# Patient Record
Sex: Female | Born: 1990 | Race: Black or African American | Hispanic: No | Marital: Single | Smoking: Former smoker
Health system: Southern US, Community
[De-identification: ages and names within clinical notes are randomized; demographics above are authoritative.]

## PROBLEM LIST (undated history)

## (undated) HISTORY — PX: INDUCED ABORTION: SHX677

---

## 2009-07-30 ENCOUNTER — Emergency Department (HOSPITAL_COMMUNITY): Admission: EM | Admit: 2009-07-30 | Discharge: 2009-07-30 | Payer: Self-pay | Admitting: Emergency Medicine

## 2010-10-28 ENCOUNTER — Inpatient Hospital Stay (INDEPENDENT_AMBULATORY_CARE_PROVIDER_SITE_OTHER)
Admission: RE | Admit: 2010-10-28 | Discharge: 2010-10-28 | Disposition: A | Discharge status: Home / Self Care | Payer: Federal, State, Local not specified - PPO | Source: Ambulatory Visit | Attending: Family Medicine | Admitting: Family Medicine

## 2010-10-28 DIAGNOSIS — N739 Female pelvic inflammatory disease, unspecified: Secondary | ICD-10-CM

## 2010-10-28 LAB — POCT URINALYSIS DIP (DEVICE)
Bilirubin Urine: NEGATIVE
Ketones, ur: NEGATIVE mg/dL
Specific Gravity, Urine: 1.025 (ref 1.005–1.030)
pH: 6 (ref 5.0–8.0)

## 2010-10-28 LAB — WET PREP, GENITAL: Trich, Wet Prep: NONE SEEN

## 2010-10-29 LAB — GC/CHLAMYDIA PROBE AMP, GENITAL: Chlamydia, DNA Probe: POSITIVE — AB

## 2012-05-13 ENCOUNTER — Emergency Department (HOSPITAL_COMMUNITY): Payer: Federal, State, Local not specified - PPO

## 2012-05-13 ENCOUNTER — Emergency Department (HOSPITAL_COMMUNITY)
Admission: EM | Admit: 2012-05-13 | Discharge: 2012-05-13 | Disposition: A | Discharge status: Home / Self Care | Payer: Federal, State, Local not specified - PPO | Attending: Emergency Medicine | Admitting: Emergency Medicine

## 2012-05-13 ENCOUNTER — Encounter (HOSPITAL_COMMUNITY): Payer: Self-pay | Admitting: Emergency Medicine

## 2012-05-13 DIAGNOSIS — F17200 Nicotine dependence, unspecified, uncomplicated: Secondary | ICD-10-CM | POA: Insufficient documentation

## 2012-05-13 DIAGNOSIS — J069 Acute upper respiratory infection, unspecified: Secondary | ICD-10-CM | POA: Insufficient documentation

## 2012-05-13 DIAGNOSIS — J40 Bronchitis, not specified as acute or chronic: Secondary | ICD-10-CM | POA: Insufficient documentation

## 2012-05-13 MED ORDER — HYDROCOD POLST-CHLORPHEN POLST 10-8 MG/5ML PO LQCR: 5.0000 mL | Freq: Once | ORAL | Status: DC

## 2012-05-13 MED ORDER — HYDROCOD POLST-CHLORPHEN POLST 10-8 MG/5ML PO LQCR: 5.0000 mL | Freq: Two times a day (BID) | ORAL | Status: DC | PRN

## 2012-05-13 MED ORDER — ALBUTEROL SULFATE (5 MG/ML) 0.5% IN NEBU
5.0000 mg | INHALATION_SOLUTION | Freq: Once | RESPIRATORY_TRACT | Status: AC
  Administered 2012-05-13: 5 mg via RESPIRATORY_TRACT
  Filled 2012-05-13: qty 1

## 2012-05-13 MED ORDER — ALBUTEROL SULFATE HFA 108 (90 BASE) MCG/ACT IN AERS
2.0000 | INHALATION_SPRAY | RESPIRATORY_TRACT | Status: DC | PRN
  Administered 2012-05-13: 2 via RESPIRATORY_TRACT
  Filled 2012-05-13: qty 6.7

## 2012-05-13 NOTE — ED Notes (Addendum)
Pt reports cough, congestion, started yesterday evening; took mucinex at 10am, and about 1400, starting coughing really bad which made her throw up; pt with some exp wheezing in upper fields; denies asthma hx

## 2012-05-13 NOTE — ED Provider Notes (Signed)
History  Scribed for [employer]. Bernette Mayers, MD, the patient was seen in room TR11C/TR11C. This chart was scribed by Candelaria Stagers. The patient's care started at 5:48 PM   CSN: [id number]  Arrival date & time 05/13/12  1632   First MD Initiated Contact with Patient 05/13/12 1703      Chief Complaint  Patient presents with  . URI     The history is provided by the patient. No language interpreter was used.   Kristie Mays is a 21 y.o. female who presents to the Emergency Department complaining of cough that started yesterday and has gradually gotten worse.  Pt is also experiencing a sore throat and back and chest pain that she associates with the cough.  Pt has taken mucinex with no relief.  Pt states the breathing treatment provided in triage helped improve sx.   History reviewed. No pertinent past medical history.  History reviewed. No pertinent past surgical history.  History reviewed. No pertinent family history.  History  Substance Use Topics  . Smoking status: Current Every Day Smoker -- 0.2 packs/day    Types: Cigarettes  . Smokeless tobacco: Not on file  . Alcohol Use: Yes     rarely    OB History    Grav Para Term Preterm Abortions TAB SAB Ect Mult Living                  Review of Systems  Constitutional: Positive for fever.  HENT: Positive for sore throat.   Respiratory: Positive for cough and wheezing.   All other systems reviewed and are negative.    Allergies  Review of patient's allergies indicates no known allergies.  Home Medications  No current outpatient prescriptions on file.  BP 124/70  Pulse 103  Temp 100.1 F (37.8 C) (Oral)  Resp 20  SpO2 99%  LMP 05/07/2012  Physical Exam  Nursing note and vitals reviewed. Constitutional: She is oriented to person, place, and time. She appears well-developed and well-nourished.  HENT:  Head: Normocephalic and atraumatic.  Eyes: EOM are normal. Pupils are equal, round, and reactive to light.    Neck: Normal range of motion. Neck supple.  Cardiovascular: Normal rate, normal heart sounds and intact distal pulses.   Pulmonary/Chest: Effort normal and breath sounds normal. She has no wheezes. She has no rales.       rhonchi   Abdominal: Bowel sounds are normal. She exhibits no distension. There is no tenderness.  Musculoskeletal: Normal range of motion. She exhibits no edema and no tenderness.  Neurological: She is alert and oriented to person, place, and time. She has normal strength. No cranial nerve deficit or sensory deficit.  Skin: Skin is warm and dry. No rash noted.  Psychiatric: She has a normal mood and affect.    ED Course  Procedures   DIAGNOSTIC STUDIES:     COORDINATION OF CARE:  16:43 Ordered: DG Chest 2 View   Labs Reviewed - No data to display Dg Chest 2 View (if Patient Has Fever And/or Copd)  05/13/2012  *RADIOLOGY REPORT*  Clinical Data: Cough, congestion, fever  CHEST - 2 VIEW  Comparison: None.  Findings: Lungs are clear. No pleural effusion or pneumothorax.  Cardiomediastinal silhouette is within normal limits.  Visualized osseous structures are within normal limits.  IMPRESSION: No evidence of acute cardiopulmonary disease.   Original Report Authenticated By: Charline Bills, M.D.      No diagnosis found.    MDM  CXR neg, symptoms consistent  with viral bronchitis. Advised inhaler, cough suppressants and OTC medications for other symptoms.  I personally performed the services described in the documentation, which were scribed in my presence. The recorded information has been reviewed and considered.         Charles B. Bernette Mayers, MD 05/13/12 1759

## 2012-05-13 NOTE — ED Notes (Signed)
Patient transported to and X-ray

## 2012-05-20 ENCOUNTER — Emergency Department (HOSPITAL_COMMUNITY)
Admission: EM | Admit: 2012-05-20 | Discharge: 2012-05-20 | Disposition: A | Discharge status: Home / Self Care | Payer: Federal, State, Local not specified - PPO | Source: Home / Self Care

## 2012-05-20 NOTE — ED Notes (Signed)
Pt cut her right middle finger w/a knife while cooking... Pt was responsive and talking in complete sentences w/no signs of distress... Notified Selena Batten, RN about situation.

## 2019-10-20 ENCOUNTER — Ambulatory Visit
Admission: EM | Admit: 2019-10-20 | Discharge: 2019-10-20 | Disposition: A | Discharge status: Home / Self Care | Payer: 59 | Attending: Physician Assistant | Admitting: Physician Assistant

## 2019-10-20 ENCOUNTER — Encounter: Payer: Self-pay | Admitting: Emergency Medicine

## 2019-10-20 ENCOUNTER — Other Ambulatory Visit: Payer: Self-pay

## 2019-10-20 DIAGNOSIS — H9201 Otalgia, right ear: Secondary | ICD-10-CM

## 2019-10-20 MED ORDER — AZELASTINE HCL 0.1 % NA SOLN: 2.0000 | Freq: Two times a day (BID) | NASAL | 0 refills | Status: AC

## 2019-10-20 NOTE — Discharge Instructions (Signed)
Start flonse as directed daily for 1 week, if symptoms not improving, can add on azelastine nasal spray. Continue medications until right ear pain/muffled hearing resolves.

## 2019-10-20 NOTE — ED Triage Notes (Signed)
Patient c/o right ear pain and pressure that started 4 days ago. States her hearing is muffled in her right ear. Denies any other symptoms.

## 2019-10-20 NOTE — ED Provider Notes (Signed)
[facility]    CSN: [id number] Arrival date & time: 10/20/19  1353      History   Chief Complaint Chief Complaint  Patient presents with  . Otalgia    right    HPI Kristie Mays is a 29 y.o. female.   29 year old female comes in for 4 day history of right ear pain. Has muffled hearing, pressure. Denies ear drainage. Denies fever, chills, body aches. Denies URI symptoms such as cough, congestion, sore throat. Denies allergy symptoms such as sneezing, watering eyes. Cotton swab use. No swimming. No medication use.      History reviewed. No pertinent past medical history.  There are no problems to display for this patient.   History reviewed. No pertinent surgical history.  OB History   No obstetric history on file.      Home Medications    Prior to Admission medications   Medication Sig Start Date End Date Taking? Authorizing Provider  azelastine (ASTELIN) 0.1 % nasal spray Place 2 sprays into both nostrils 2 (two) times daily. 10/20/19   Ok Edwards, PA-C    Family History Family History  Problem Relation Age of Onset  . Healthy Mother   . Healthy Father     Social History Social History   Tobacco Use  . Smoking status: Current Every Day Smoker    Packs/day: 0.25    Types: Cigarettes  . Smokeless tobacco: Never Used  Substance Use Topics  . Alcohol use: Yes    Comment: rarely  . Drug use: Yes    Types: Marijuana    Comment: last use 10/19/2019     Allergies   Patient has no known allergies.   Review of Systems Review of Systems  Reason unable to perform ROS: See HPI as above.     Physical Exam Triage Vital Signs ED Triage Vitals  Enc Vitals Group     BP 10/20/19 1404 117/79     Pulse Rate 10/20/19 1404 73     Resp 10/20/19 1404 16     Temp 10/20/19 1404 98.2 F (36.8 C)     Temp Source 10/20/19 1404 Oral     SpO2 10/20/19 1404 98 %     Weight 10/20/19 1414 160 lb (72.6 kg)     Height 10/20/19 1414 5\' 2"  (1.575 m)      Head Circumference --      Peak Flow --      Pain Score 10/20/19 1414 6     Pain Loc --      Pain Edu? --      Excl. in [place]? --    No data found.  Updated Vital Signs BP 117/79 (BP Location: Left Arm)   Pulse 73   Temp 98.2 F (36.8 C) (Oral)   Resp 16   Ht 5\' 2"  (1.575 m)   Wt 160 lb (72.6 kg)   LMP 10/16/2019   SpO2 98%   BMI 29.26 kg/m   Visual Acuity Right Eye Distance:   Left Eye Distance:   Bilateral Distance:    Right Eye Near:   Left Eye Near:    Bilateral Near:     Physical Exam Constitutional:      General: She is not in acute distress.    Appearance: Normal appearance. She is well-developed. She is not toxic-appearing or diaphoretic.  HENT:     Head: Normocephalic and atraumatic.     Ears:     Comments: No tenderness  to tragus. Left TM with slight erythema, no bulging.   Right ear with cerumen impaction, TM not visible.   Post ear irrigation: right ear TM with slight erythema, no bulging.  Eyes:     Conjunctiva/sclera: Conjunctivae normal.     Pupils: Pupils are equal, round, and reactive to light.  Pulmonary:     Effort: Pulmonary effort is normal. No respiratory distress.     Comments: Speaking in full sentences without difficulty Musculoskeletal:     Cervical back: Normal range of motion and neck supple.  Skin:    General: Skin is warm and dry.  Neurological:     Mental Status: She is alert and oriented to person, place, and time.      UC Treatments / Results  Labs (all labs ordered are listed, but only abnormal results are displayed) Labs Reviewed - No data to display  EKG   Radiology No results found.  Procedures Procedures (including critical care time)  Medications Ordered in UC Medications - No data to display  Initial Impression / Assessment and Plan / UC Course  I have reviewed the triage vital signs and the nursing notes.  Pertinent labs & imaging results that were available during my care of the patient were  reviewed by me and considered in my medical decision making (see chart for details).    Patient tolerated ear irrigation well. TM visible without otitis media. discussed may have eustachian tube dysfunction contributing symptoms. Start flonase, ca add on azelastine if symptoms not improving. Return precautions given.   Final Clinical Impressions(s) / UC Diagnoses   Final diagnoses:  Right ear pain    ED Prescriptions    Medication Sig Dispense Auth. Provider   azelastine (ASTELIN) 0.1 % nasal spray Place 2 sprays into both nostrils 2 (two) times daily. 30 mL Belinda Fisher, PA-C     PDMP not reviewed this encounter.   Belinda Fisher, PA-C 10/20/19 1606

## 2020-02-12 ENCOUNTER — Emergency Department (HOSPITAL_COMMUNITY)
Admission: EM | Admit: 2020-02-12 | Discharge: 2020-02-12 | Disposition: A | Discharge status: Home / Self Care | Payer: 59 | Attending: Emergency Medicine | Admitting: Emergency Medicine

## 2020-02-12 ENCOUNTER — Encounter (HOSPITAL_COMMUNITY): Payer: Self-pay

## 2020-02-12 ENCOUNTER — Other Ambulatory Visit: Payer: Self-pay

## 2020-02-12 DIAGNOSIS — N751 Abscess of Bartholin's gland: Secondary | ICD-10-CM | POA: Diagnosis not present

## 2020-02-12 DIAGNOSIS — Z5321 Procedure and treatment not carried out due to patient leaving prior to being seen by health care provider: Secondary | ICD-10-CM | POA: Insufficient documentation

## 2020-02-12 NOTE — ED Triage Notes (Signed)
[facility] with bartholin cyst to be drained.PT reports this has been present since Friday.

## 2020-02-12 NOTE — ED Notes (Signed)
Pt states she is unable to wait to be seen and is going to go home. Pt encouraged to stay. Pt ambulatory out of dept.

## 2020-02-12 NOTE — ED Notes (Signed)
Called pt for triage vitals. No response.

## 2021-01-28 ENCOUNTER — Encounter (HOSPITAL_COMMUNITY): Payer: Self-pay | Admitting: Emergency Medicine

## 2021-01-28 ENCOUNTER — Emergency Department (HOSPITAL_COMMUNITY): Payer: No Typology Code available for payment source

## 2021-01-28 ENCOUNTER — Ambulatory Visit (HOSPITAL_COMMUNITY)
Admission: EM | Admit: 2021-01-28 | Discharge: 2021-01-28 | Disposition: A | Discharge status: Home / Self Care | Payer: Self-pay

## 2021-01-28 ENCOUNTER — Other Ambulatory Visit: Payer: Self-pay

## 2021-01-28 ENCOUNTER — Emergency Department (HOSPITAL_COMMUNITY)
Admission: EM | Admit: 2021-01-28 | Discharge: 2021-01-28 | Disposition: A | Discharge status: Home / Self Care | Payer: No Typology Code available for payment source | Attending: Emergency Medicine | Admitting: Emergency Medicine

## 2021-01-28 DIAGNOSIS — W268XXA Contact with other sharp object(s), not elsewhere classified, initial encounter: Secondary | ICD-10-CM | POA: Diagnosis not present

## 2021-01-28 DIAGNOSIS — F17210 Nicotine dependence, cigarettes, uncomplicated: Secondary | ICD-10-CM | POA: Insufficient documentation

## 2021-01-28 DIAGNOSIS — Y92513 Shop (commercial) as the place of occurrence of the external cause: Secondary | ICD-10-CM | POA: Diagnosis not present

## 2021-01-28 DIAGNOSIS — S61223A Laceration with foreign body of left middle finger without damage to nail, initial encounter: Secondary | ICD-10-CM | POA: Diagnosis not present

## 2021-01-28 DIAGNOSIS — S6992XA Unspecified injury of left wrist, hand and finger(s), initial encounter: Secondary | ICD-10-CM | POA: Diagnosis present

## 2021-01-28 DIAGNOSIS — S61213A Laceration without foreign body of left middle finger without damage to nail, initial encounter: Secondary | ICD-10-CM

## 2021-01-28 MED ORDER — IBUPROFEN 400 MG PO TABS
600.0000 mg | ORAL_TABLET | Freq: Once | ORAL | Status: AC
  Administered 2021-01-28: 600 mg via ORAL
  Filled 2021-01-28: qty 1

## 2021-01-28 MED ORDER — LIDOCAINE HCL (PF) 1 % IJ SOLN
INTRAMUSCULAR | Status: AC
  Administered 2021-01-28: 5 mL
  Filled 2021-01-28: qty 5

## 2021-01-28 MED ORDER — LIDOCAINE HCL (PF) 1 % IJ SOLN: 5.0000 mL | Freq: Once | INTRAMUSCULAR | Status: AC

## 2021-01-28 MED ORDER — ACETAMINOPHEN 500 MG PO TABS
1000.0000 mg | ORAL_TABLET | Freq: Once | ORAL | Status: AC
  Administered 2021-01-28: 1000 mg via ORAL
  Filled 2021-01-28: qty 2

## 2021-01-28 NOTE — ED Triage Notes (Signed)
Pt reports a work injury to left middle finger with metal at 130. Sent from [facility] stating they cannot do stitches.

## 2021-01-28 NOTE — ED Provider Notes (Signed)
Emergency Medicine Provider Triage Evaluation Note  Kristie Mays , a 30 y.o. female  was evaluated in triage.  Pt complains of patient presents with a injury to her left middle finger, states she was using a sanding tool to clean a ring and unfortunately the tool and/or the ring cut her left middle finger.  She states she is up-to-date on her tetanus shot, not immunocompromise.  Has no complaints this time..  Review of Systems  Positive: Laceration to left middle finger, middle finger pain Negative: Chest pain or shortness of breath  Physical Exam  BP (!) 121/93 (BP Location: Left Arm)   Pulse 74   Temp 98.2 F (36.8 C) (Oral)   Resp (!) 21   SpO2 100%  Gen:   Awake, no distress   Resp:  Normal effort  MSK:   Moves extremities without difficulty  Other:    Medical Decision Making  Medically screening exam initiated at 5:42 PM.  Appropriate orders placed.  Anica E Denson was informed that the remainder of the evaluation will be completed by another provider, this initial triage assessment does not replace that evaluation, and the importance of remaining in the ED until their evaluation is complete.  Patient presents with laceration to her left middle finger.  Imaging has been ordered.   Carroll Sage, PA-C 01/28/21 1743    Melene Plan, DO 01/28/21 1932

## 2021-01-28 NOTE — Discharge Instructions (Addendum)
The area can get wet but not fully immersed underwater.  No scrubbing.  If you really want to clean it you can apply a half-and-half hydrogen peroxide solution with water on a Q-tip.  You can apply an ointment a couple times a day this could be as simple as Vaseline but could also be an antibiotic ointment if you wish..  Once it is healed please try to avoid prolonged sun exposure use sunscreen.

## 2021-01-28 NOTE — ED Notes (Signed)
Pt provided discharge instructions and prescription information. Pt was given the opportunity to ask questions and questions were answered. Discharge signature not obtained in the setting of the COVID-19 pandemic in order to reduce high touch surfaces.  ° °

## 2021-01-29 NOTE — ED Provider Notes (Signed)
[facility] EMERGENCY DEPARTMENT Provider Note   CSN: [id number] Arrival date & time: 01/28/21  1535     History Chief Complaint  Patient presents with   Hand Injury    Kristie Mays is a 30 y.o. female.  30 yo F with a chief complaints of finger laceration.  The patient works in a [employer] and she was doing repair on the ring and had a sander tool and it got stuck in the ring and ended up cutting through the palmar aspect of the left third digit.  Severely painful.  Denies any other injury tetanus up-to-date.  The history is provided by the patient.  Hand Injury Location:  Finger Finger location:  L middle finger Injury: yes   Time since incident:  2 hours Mechanism of injury: crush   Crush injury:    Mechanism:  Hand tool   Duration of crushing force:  2 seconds Pain details:    Quality:  Aching   Radiates to:  Does not radiate   Severity:  Moderate   Onset quality:  Gradual   Duration:  6 hours   Timing:  Constant   Progression:  Worsening Handedness:  Right-handed Dislocation: no   Foreign body present:  No foreign bodies Tetanus status:  Up to date Prior injury to area:  Yes Relieved by:  Nothing Worsened by:  Nothing Ineffective treatments:  None tried Associated symptoms: no fever       History reviewed. No pertinent past medical history.  There are no problems to display for this patient.   History reviewed. No pertinent surgical history.   OB History   No obstetric history on file.     Family History  Problem Relation Age of Onset   Healthy Mother    Healthy Father     Social History   Tobacco Use   Smoking status: Every Day    Packs/day: 0.25    Pack years: 0.00    Types: Cigarettes   Smokeless tobacco: Never  Vaping Use   Vaping Use: Never used  Substance Use Topics   Alcohol use: Yes    Comment: rarely   Drug use: Yes    Types: Marijuana    Comment: last use 10/19/2019    Home  Medications Prior to Admission medications   Medication Sig Start Date End Date Taking? Authorizing Provider  acetaminophen (TYLENOL) 500 MG tablet Take 500 mg by mouth every 6 (six) hours as needed for mild pain.    [provider]  azelastine (ASTELIN) 0.1 % nasal spray Place 2 sprays into both nostrils 2 (two) times daily. Patient not taking: Reported on 02/12/2020 10/20/19   Belinda Fisher, PA-C    Allergies    Patient has no known allergies.  Review of Systems   Review of Systems  Constitutional:  Negative for chills and fever.  HENT:  Negative for congestion and rhinorrhea.   Eyes:  Negative for redness and visual disturbance.  Respiratory:  Negative for shortness of breath and wheezing.   Cardiovascular:  Negative for chest pain and palpitations.  Gastrointestinal:  Negative for nausea and vomiting.  Genitourinary:  Negative for dysuria and urgency.  Musculoskeletal:  Negative for arthralgias and myalgias.  Skin:  Positive for wound. Negative for pallor.  Neurological:  Negative for dizziness and headaches.   Physical Exam Updated Vital Signs BP 140/86   Pulse 66   Temp 98.2 F (36.8 C) (Oral)   Resp 20   SpO2  100%   Physical Exam Vitals and nursing note reviewed.  Constitutional:      General: She is not in acute distress.    Appearance: She is well-developed. She is not diaphoretic.  HENT:     Head: Normocephalic and atraumatic.  Eyes:     Pupils: Pupils are equal, round, and reactive to light.  Cardiovascular:     Rate and Rhythm: Normal rate and regular rhythm.     Heart sounds: No murmur heard.   No friction rub. No gallop.  Pulmonary:     Effort: Pulmonary effort is normal.     Breath sounds: No wheezing or rales.  Abdominal:     General: There is no distension.     Palpations: Abdomen is soft.     Tenderness: There is no abdominal tenderness.  Musculoskeletal:        General: Swelling and tenderness present.     Cervical back: Normal range of  motion and neck supple.     Comments: Abraded skin along the DIP ROM intact with pain.   Skin:    General: Skin is warm and dry.  Neurological:     Mental Status: She is alert and oriented to person, place, and time.  Psychiatric:        Behavior: Behavior normal.    ED Results / Procedures / Treatments   Labs (all labs ordered are listed, but only abnormal results are displayed) Labs Reviewed - No data to display  EKG None  Radiology DG Finger Middle Left  Result Date: 01/28/2021 CLINICAL DATA:  Soft tissue injury ventral bed injury EXAM: LEFT MIDDLE FINGER 2+V COMPARISON:  None. FINDINGS: Soft tissue swelling along the ulnar aspect of the middle digit. Fine details obscured by overlying bandage material. No evidence of foreign body. No osseous abnormality. IMPRESSION: 1. No osseous abnormality.  No foreign body identified. 2. Soft tissue injury as above. Electronically Signed   By: Genevive Bi M.D.   On: 01/28/2021 19:16    Procedures .Marland KitchenLaceration Repair  Date/Time: 01/29/2021 3:12 PM Performed by: Melene Plan, DO Authorized by: Melene Plan, DO   Consent:    Consent obtained:  Verbal   Consent given by:  Patient   Risks discussed:  Infection, pain, poor cosmetic result and poor wound healing   Alternatives discussed:  No treatment, delayed treatment and observation Universal protocol:    Procedure explained and questions answered to patient or proxy's satisfaction: yes     Imaging studies available: yes     Patient identity confirmed:  Verbally with patient Anesthesia:    Anesthesia method:  Nerve block   Block location:  Digital   Block needle gauge:  27 G   Block anesthetic:  Lidocaine 2% WITH epi   Block technique:  Digital   Block injection procedure:  Anatomic landmarks identified and anatomic landmarks palpated   Block outcome:  Anesthesia achieved Pre-procedure details:    Preparation:  Patient was prepped and draped in usual sterile fashion Exploration:     Hemostasis achieved with:  Epinephrine and direct pressure   Wound exploration: entire depth of wound visualized     Wound extent: no underlying fracture noted     Wound extent comment:  No obvious tendon damage, ROM slightly limited due to pain   Contaminated: no   Treatment:    Area cleansed with:  Saline   Amount of cleaning:  Extensive   Irrigation solution:  Sterile saline   Irrigation volume:  50  Irrigation method:  Syringe   Visualized foreign bodies/material removed: no     Debridement:  None   Undermining:  None   Scar revision: no     Layers/structures repaired:  Deep dermal/superficial fascia Deep dermal/superficial fascia:    Suture size:  3-0   Suture material:  Vicryl   Suture technique:  Simple interrupted   Number of sutures:  3 Approximation:    Approximation:  Loose Repair type:    Repair type:  Simple Post-procedure details:    Dressing:  Antibiotic ointment, adhesive bandage and bulky dressing Comments:     No skin available for closure, gaping soft tissue closed with vicryl.     Medications Ordered in ED Medications  lidocaine (PF) (XYLOCAINE) 1 % injection 5 mL (5 mLs Infiltration Given by Other 01/28/21 2247)  acetaminophen (TYLENOL) tablet 1,000 mg (1,000 mg Oral Given 01/28/21 2246)  ibuprofen (ADVIL) tablet 600 mg (600 mg Oral Given 01/28/21 2246)    ED Course  I have reviewed the triage vital signs and the nursing notes.  Pertinent labs & imaging results that were available during my care of the patient were reviewed by me and considered in my medical decision making (see chart for details).    MDM Rules/Calculators/A&P                          30 yo F with a chief complaints of the finger injury with a tool used to clean jewelry.  Patient has a large avulsion injury with some missing soft tissue as well.  Soft tissue was approximated but unable to close the skin due to the amount of loss.  Left to heal by secondary intention.  Return  precautions given.  PCP follow-up.  3:16 PM:  I have discussed the diagnosis/risks/treatment options with the patient and believe the pt to be eligible for discharge home to follow-up with PCP. We also discussed returning to the ED immediately if new or worsening sx occur. We discussed the sx which are most concerning (e.g., sudden worsening pain, fever, inability to tolerate by mouth) that necessitate immediate return. Medications administered to the patient during their visit and any new prescriptions provided to the patient are listed below.  Medications given during this visit Medications  lidocaine (PF) (XYLOCAINE) 1 % injection 5 mL (5 mLs Infiltration Given by Other 01/28/21 2247)  acetaminophen (TYLENOL) tablet 1,000 mg (1,000 mg Oral Given 01/28/21 2246)  ibuprofen (ADVIL) tablet 600 mg (600 mg Oral Given 01/28/21 2246)     The patient appears reasonably screen and/or stabilized for discharge and I doubt any other medical condition or other [facility] requiring further screening, evaluation, or treatment in the ED at this time prior to discharge.   Final Clinical Impression(s) / ED Diagnoses Final diagnoses:  Laceration of left middle finger without foreign body without damage to nail, initial encounter    Rx / DC Orders ED Discharge Orders     None        Melene Plan, DO 01/29/21 1516

## 2022-05-01 IMAGING — DX DG FINGER MIDDLE 2+V*L*
3 series · 3 of 3 positions shown · non-contrast
Comparison: None.

CLINICAL DATA: Soft tissue injury ventral bed injury

EXAM:
LEFT MIDDLE FINGER 2+V

[x finger pa left]
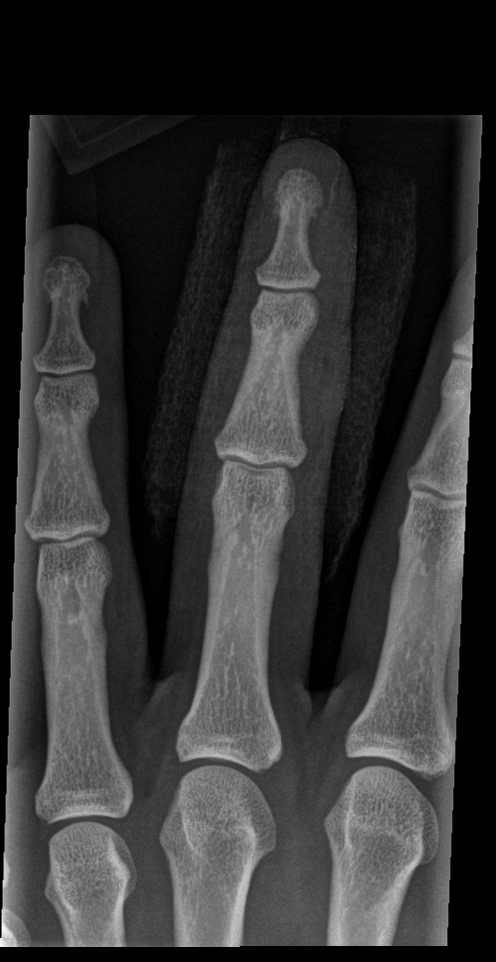

[x finger obl left]
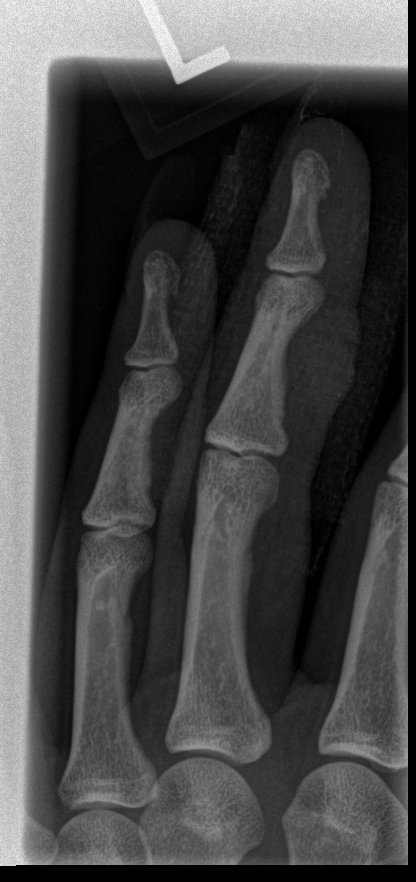

[x finger lat left]
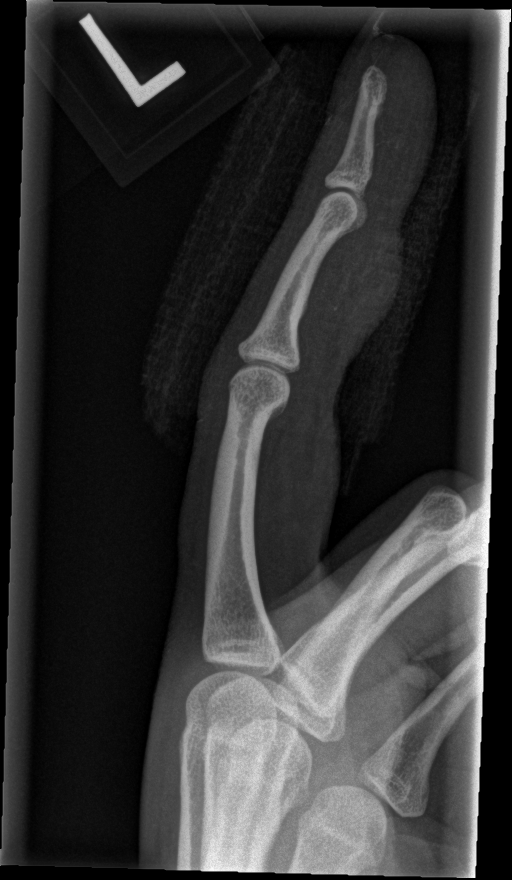

[3 of 3 positions shown; findings below may reference images not displayed]

FINDINGS: Soft tissue swelling along the ulnar aspect of the middle digit.
Fine details obscured by overlying bandage material. No evidence of
foreign body. No osseous abnormality.
IMPRESSION: 1. No osseous abnormality.  No foreign body identified.
2. Soft tissue injury as above.

## 2023-11-16 ENCOUNTER — Encounter: Payer: Self-pay | Admitting: Emergency Medicine

## 2023-11-16 ENCOUNTER — Ambulatory Visit: Admission: EM | Admit: 2023-11-16 | Discharge: 2023-11-16 | Disposition: A | Discharge status: Home / Self Care

## 2023-11-16 DIAGNOSIS — H6121 Impacted cerumen, right ear: Secondary | ICD-10-CM | POA: Diagnosis not present

## 2023-11-16 NOTE — ED Triage Notes (Signed)
 Pt c/o right ear pain. She says she cannot hear out of it since this morning.

## 2023-11-16 NOTE — ED Provider Notes (Signed)
 Kristie Mays    CSN: [id number] Arrival date & time: 11/16/23  1234      History   Chief Complaint Chief Complaint  Patient presents with   Otalgia    HPI Kristie Mays is a 33 y.o. female.    Otalgia Patient here with right ear loss of hearing times today.  She has a history of cerumen accumulation in both of her ears and reports she was attempting to irrigate the wax out of her ear and thinks she may have pushed it further down the canal. Endorses no significant pain. No URI symptoms or fever.  History reviewed. No pertinent past medical history.  There are no active problems to display for this patient.   History reviewed. No pertinent surgical history.  OB History   No obstetric history on file.      Home Medications    Prior to Admission medications   Medication Sig Start Date End Date Taking? Authorizing Provider  acetaminophen  (TYLENOL ) 500 MG tablet Take 500 mg by mouth every 6 (six) hours as needed for mild pain.    [provider]  azelastine  (ASTELIN ) 0.1 % nasal spray Place 2 sprays into both nostrils 2 (two) times daily. Patient not taking: Reported on 02/12/2020 10/20/19   Lasandra Points    Family History Family History  Problem Relation Age of Onset   Healthy Mother    Healthy Father     Social History Social History   Tobacco Use   Smoking status: Every Day    Current packs/day: 0.25    Types: Cigarettes    Passive exposure: Current   Smokeless tobacco: Never  Vaping Use   Vaping status: Never Used  Substance Use Topics   Alcohol use: Yes    Comment: rarely   Drug use: Yes    Types: Marijuana    Comment: last use 10/19/2019     Allergies   Patient has no known allergies.   Review of Systems Review of Systems  HENT:  Positive for ear pain.      Physical Exam Triage Vital Signs ED Triage Vitals  Encounter Vitals Group     BP 11/16/23 1346 116/79     Systolic BP Percentile --      Diastolic BP  Percentile --      Pulse Rate 11/16/23 1346 69     Resp 11/16/23 1346 18     Temp 11/16/23 1346 98 F (36.7 C)     Temp Source 11/16/23 1346 Oral     SpO2 11/16/23 1346 98 %     Weight 11/16/23 1345 160 lb 0.9 oz (72.6 kg)     Height --      Head Circumference --      Peak Flow --      Pain Score 11/16/23 1345 3     Pain Loc --      Pain Education --      Exclude from Growth Chart --    No data found.  Updated Vital Signs BP 116/79 (BP Location: Left Arm)   Pulse 69   Temp 98 F (36.7 C) (Oral)   Resp 18   Wt 160 lb 0.9 oz (72.6 kg)   LMP  (LMP Unknown)   SpO2 98%   BMI 29.27 kg/m   Visual Acuity Right Eye Distance:   Left Eye Distance:   Bilateral Distance:    Right Eye Near:   Left Eye Near:    Bilateral  Near:     Physical Exam Vitals reviewed.  Constitutional:      Appearance: Normal appearance.  HENT:     Head: Normocephalic and atraumatic.     Right Ear: There is impacted cerumen.     Left Ear: There is no impacted cerumen.     Nose: Nose normal.  Eyes:     Extraocular Movements: Extraocular movements intact.     Pupils: Pupils are equal, round, and reactive to light.  Cardiovascular:     Rate and Rhythm: Normal rate and regular rhythm.  Pulmonary:     Effort: Pulmonary effort is normal.     Breath sounds: Normal breath sounds.  Musculoskeletal:        General: Normal range of motion.     Cervical back: Normal range of motion and neck supple.  Neurological:     General: No focal deficit present.     Mental Status: She is alert and oriented to person, place, and time.  Psychiatric:        Mood and Affect: Mood normal.      UC Treatments / Results  Labs (all labs ordered are listed, but only abnormal results are displayed) Labs Reviewed - No data to display  EKG   Radiology No results found.  Procedures Procedures (including critical care time)  Medications Ordered in UC Medications - No data to display  Initial Impression /  Assessment and Plan / UC Course  I have reviewed the triage vital signs and the nursing notes.  Pertinent labs & imaging results that were available during my care of the patient were reviewed by me and considered in my medical decision making (see chart for details).   Hearing Loss related to cerumen impactions, nursing performed ear lavage with successful removal of ear wax. Right ear re-evaluated and cerumen completely removed. Return precautions given as needed. Final Clinical Impressions(s) / UC Diagnoses   Final diagnoses:  Hearing loss of right ear due to cerumen impaction   Discharge Instructions   None    ED Prescriptions   None    PDMP not reviewed this encounter.   Buena Carmine, NP 11/19/23 1352

## 2023-11-25 ENCOUNTER — Other Ambulatory Visit (HOSPITAL_COMMUNITY)
Admission: RE | Admit: 2023-11-25 | Discharge: 2023-11-25 | Disposition: A | Discharge status: Home / Self Care | Source: Ambulatory Visit | Attending: Radiology | Admitting: Radiology

## 2023-11-25 ENCOUNTER — Encounter: Payer: Self-pay | Admitting: Radiology

## 2023-11-25 ENCOUNTER — Ambulatory Visit: Payer: Self-pay | Admitting: Radiology

## 2023-11-25 VITALS — BP 126/84 | HR 87 | Ht 63.5 in | Wt 185.8 lb

## 2023-11-25 DIAGNOSIS — Z113 Encounter for screening for infections with a predominantly sexual mode of transmission: Secondary | ICD-10-CM

## 2023-11-25 DIAGNOSIS — Z7185 Encounter for immunization safety counseling: Secondary | ICD-10-CM | POA: Diagnosis not present

## 2023-11-25 DIAGNOSIS — Z1331 Encounter for screening for depression: Secondary | ICD-10-CM | POA: Diagnosis not present

## 2023-11-25 DIAGNOSIS — Z01419 Encounter for gynecological examination (general) (routine) without abnormal findings: Secondary | ICD-10-CM | POA: Insufficient documentation

## 2023-11-25 DIAGNOSIS — Z30011 Encounter for initial prescription of contraceptive pills: Secondary | ICD-10-CM

## 2023-11-25 MED ORDER — NORETHIN-ETH ESTRAD-FE BIPHAS 1 MG-10 MCG / 10 MCG PO TABS: 1.0000 | ORAL_TABLET | Freq: Every day | ORAL | 1 refills | Status: DC

## 2023-11-25 NOTE — Patient Instructions (Signed)

## 2023-11-25 NOTE — Progress Notes (Signed)
 Kristie Mays [birth year] [id number]   History:  33 y.o. G3P0 presents for annual exam.Last Depo Provera injection was due 11/02/23 has had irregular bleeding since. No other gyn concerns.  Gynecologic History No LMP recorded (lmp unknown). (Menstrual status: Irregular Periods). Period Cycle (Days):  (28--off depo provera) Period Duration (Days): 7 Period Pattern: (!) Irregular (irregular since stopping depo provera 11/02/23) Menstrual Flow: Moderate Menstrual Control: Tampon (menstrual cup) Dysmenorrhea: (!) Severe Dysmenorrhea Symptoms: Cramping Contraception/Family planning: abstinence and Depo-Provera injections Sexually active: no Last Pap: ?2022. Results were: normal   Obstetric History OB History  Gravida Para Term Preterm AB Living  3 0   3 0  SAB IAB Ectopic Multiple Live Births   3       # Outcome Date GA Lbr Len/2nd Weight Sex Type Anes PTL Lv  3 IAB           2 IAB           1 IAB                11/25/2023   11:03 AM  Depression screen PHQ 2/9  Decreased Interest 0  Down, Depressed, Hopeless 0  PHQ - 2 Score 0     The following portions of the patient's history were reviewed and updated as appropriate: allergies, current medications, past family history, past medical history, past social history, past surgical history, and problem list.  Review of Systems  All other systems reviewed and are negative.   Past medical history, past surgical history, family history and social history were all reviewed and documented in the EPIC chart.  Exam:  Vitals:   11/25/23 1100  BP: 126/84  Pulse: 87  SpO2: 97%  Weight: 185 lb 12.8 oz (84.3 kg)  Height: 5' 3.5" (1.613 m)   Body mass index is 32.4 kg/m.  Physical Exam Vitals and nursing note reviewed. Exam conducted with a chaperone present.  Constitutional:      Appearance: Normal appearance. She is normal weight.  HENT:     Head: Normocephalic and atraumatic.  Neck:     Thyroid: No thyroid mass,  thyromegaly or thyroid tenderness.  Cardiovascular:     Rate and Rhythm: Regular rhythm.     Heart sounds: Normal heart sounds.  Pulmonary:     Effort: Pulmonary effort is normal.     Breath sounds: Normal breath sounds.  Chest:  Breasts:    Breasts are symmetrical.     Right: Normal. No inverted nipple, mass, nipple discharge, skin change or tenderness.     Left: Normal. No inverted nipple, mass, nipple discharge, skin change or tenderness.  Abdominal:     General: Abdomen is flat. Bowel sounds are normal.     Palpations: Abdomen is soft.  Genitourinary:    General: Normal vulva.     Vagina: Normal. No vaginal discharge, bleeding or lesions.     Cervix: Normal. No discharge or lesion.     Uterus: Normal. Not enlarged and not tender.      Adnexa: Right adnexa normal and left adnexa normal.       Right: No mass, tenderness or fullness.         Left: No mass, tenderness or fullness.    Lymphadenopathy:     Upper Body:     Right upper body: No axillary adenopathy.     Left upper body: No axillary adenopathy.  Skin:    General: Skin is warm and dry.  Neurological:  Mental Status: She is alert and oriented to person, place, and time.  Psychiatric:        Mood and Affect: Mood normal.        Thought Content: Thought content normal.        Judgment: Judgment normal.      Ellis Guys, CMA present for exam  Assessment/Plan:   1. Well woman exam with routine gynecological exam (Primary) - Cytology - PAP( [facility])  2. Screening for STDs (sexually transmitted diseases) - Cytology - PAP( )  3. Depression screening negative  4. Oral contraception initiation All options discussed, risks and benefits reviewed. Would like to start a low dose OCP to help with bleeding - Norethindrone-Ethinyl Estradiol-Fe Biphas (LO LOESTRIN FE) 1 MG-10 MCG / 10 MCG tablet; Take 1 tablet by mouth daily.  Dispense: 84 tablet; Refill: 1  5. Counseling for HPV (human papillomavirus)  vaccination Will start at follow up visit, did not want today    Return in about 12 weeks (around 02/17/2024) for Med Follow-up and gardasil.  Synetta Eves B WHNP-BC 11:41 AM 11/25/2023

## 2023-11-30 ENCOUNTER — Ambulatory Visit: Payer: Self-pay | Admitting: Radiology

## 2023-11-30 DIAGNOSIS — B9689 Other specified bacterial agents as the cause of diseases classified elsewhere: Secondary | ICD-10-CM

## 2023-11-30 LAB — CYTOLOGY - PAP
Adequacy: ABSENT
Chlamydia: NEGATIVE
Comment: NEGATIVE
Comment: NEGATIVE
Comment: NEGATIVE
Comment: NORMAL
Diagnosis: NEGATIVE
High risk HPV: NEGATIVE
Neisseria Gonorrhea: NEGATIVE
Trichomonas: NEGATIVE

## 2023-11-30 MED ORDER — METRONIDAZOLE 0.75 % VA GEL: 1.0000 | Freq: Every day | VAGINAL | 0 refills | Status: AC

## 2024-02-17 ENCOUNTER — Encounter: Payer: Self-pay | Admitting: Radiology

## 2024-02-17 ENCOUNTER — Ambulatory Visit (INDEPENDENT_AMBULATORY_CARE_PROVIDER_SITE_OTHER): Admitting: Radiology

## 2024-02-17 VITALS — BP 116/74 | HR 80 | Wt 189.0 lb

## 2024-02-17 DIAGNOSIS — Z3041 Encounter for surveillance of contraceptive pills: Secondary | ICD-10-CM

## 2024-02-17 DIAGNOSIS — Z23 Encounter for immunization: Secondary | ICD-10-CM

## 2024-02-17 MED ORDER — NORETHIN-ETH ESTRAD-FE BIPHAS 1 MG-10 MCG / 10 MCG PO TABS: 1.0000 | ORAL_TABLET | Freq: Every day | ORAL | 3 refills | Status: AC

## 2024-02-17 NOTE — Progress Notes (Signed)
   Shann Lewellyn Gause 1990-12-06 [id number]   History:  33 y.o. G3P0 presents for follow up after starting LoLoestrin for cycle control. Last Depo Provera injection was due 11/02/23 has had irregular bleeding since. No other gyn concerns. Interested in started gardasil series today.  Gynecologic History Patient's last menstrual period was 02/12/2024 (exact date).   Contraception/Family planning: abstinence and Depo-Provera injections Sexually active: no Last Pap: ?2022. Results were: normal   Obstetric History OB History  Gravida Para Term Preterm AB Living  3 0   3 0  SAB IAB Ectopic Multiple Live Births   3       # Outcome Date GA Lbr Len/2nd Weight Sex Type Anes PTL Lv  3 IAB           2 IAB           1 IAB                11/25/2023   11:03 AM  Depression screen PHQ 2/9  Decreased Interest 0  Down, Depressed, Hopeless 0  PHQ - 2 Score 0     The following portions of the patient's history were reviewed and updated as appropriate: allergies, current medications, past family history, past medical history, past social history, past surgical history, and problem list.  Review of Systems  All other systems reviewed and are negative.   Past medical history, past surgical history, family history and social history were all reviewed and documented in the EPIC chart.  Exam:  Vitals:   02/17/24 0843  BP: 116/74  Pulse: 80  SpO2: 98%  Weight: 189 lb (85.7 kg)   Body mass index is 32.95 kg/m. Physical Exam Constitutional:      Appearance: Normal appearance. She is obese.  Pulmonary:     Effort: Pulmonary effort is normal.  Neurological:     Mental Status: She is alert.  Psychiatric:        Mood and Affect: Mood normal.        Thought Content: Thought content normal.        Judgment: Judgment normal.       Darice Hoit, CMA present for exam  Assessment/Plan:   1. Oral contraceptive pill surveillance (Primary) - Norethindrone-Ethinyl Estradiol-Fe Biphas (LO  LOESTRIN FE) 1 MG-10 MCG / 10 MCG tablet; Take 1 tablet by mouth daily.  Dispense: 84 tablet; Refill: 3   2. Need for HPV vaccination (Primary) - HPV 9-valent vaccine,Recombinat   Return in about 9 months (around 11/25/2024) for Annual.  GINETTE COZIER B WHNP-BC 8:56 AM 02/17/2024

## 2024-03-14 ENCOUNTER — Ambulatory Visit

## 2024-03-14 VITALS — BP 137/87 | HR 71 | Temp 98.3°F | Ht 62.0 in | Wt 187.0 lb

## 2024-03-14 DIAGNOSIS — Z114 Encounter for screening for human immunodeficiency virus [HIV]: Secondary | ICD-10-CM

## 2024-03-14 DIAGNOSIS — Z7689 Persons encountering health services in other specified circumstances: Secondary | ICD-10-CM

## 2024-03-14 DIAGNOSIS — Z131 Encounter for screening for diabetes mellitus: Secondary | ICD-10-CM | POA: Diagnosis not present

## 2024-03-14 DIAGNOSIS — Z136 Encounter for screening for cardiovascular disorders: Secondary | ICD-10-CM

## 2024-03-14 DIAGNOSIS — Z130 Encounter for screening for diseases of the blood and blood-forming organs and certain disorders involving the immune mechanism: Secondary | ICD-10-CM

## 2024-03-14 DIAGNOSIS — Z0100 Encounter for examination of eyes and vision without abnormal findings: Secondary | ICD-10-CM

## 2024-03-14 DIAGNOSIS — Z0000 Encounter for general adult medical examination without abnormal findings: Secondary | ICD-10-CM

## 2024-03-14 DIAGNOSIS — Z13228 Encounter for screening for other metabolic disorders: Secondary | ICD-10-CM | POA: Diagnosis not present

## 2024-03-14 DIAGNOSIS — Z13220 Encounter for screening for lipoid disorders: Secondary | ICD-10-CM

## 2024-03-14 DIAGNOSIS — Z1159 Encounter for screening for other viral diseases: Secondary | ICD-10-CM

## 2024-03-14 LAB — POCT GLYCOSYLATED HEMOGLOBIN (HGB A1C): Hemoglobin A1C: 6.5 % — AB (ref 4.0–5.6)

## 2024-03-14 NOTE — Progress Notes (Unsigned)
 Patient ID: Kristie Mays, female    DOB: 07/16/1991  MRN: [id number]  CC: Establish Care (Patient is here to established care with provider./~health hx address/~care gaps address/ )   Subjective: Kristie Mays is a 33 y.o. female with no pertinent past medical history who presents to clinic to establish care and annual physical. Only concern today is weight gain over the past year. Reports she recently made diet changes and is trying to avoid fast food and cook at home more. Plans to increase exercise.  G3P0, 3 elective abortions.   Last pap smear: 5/8 normal results  There are no active problems to display for this patient.    Current Outpatient Medications on File Prior to Visit  Medication Sig Dispense Refill   acetaminophen  (TYLENOL ) 500 MG tablet Take 500 mg by mouth every 6 (six) hours as needed for mild pain.     azelastine  (ASTELIN ) 0.1 % nasal spray Place 2 sprays into both nostrils 2 (two) times daily. 30 mL 0   Norethindrone-Ethinyl Estradiol-Fe Biphas (LO LOESTRIN FE) 1 MG-10 MCG / 10 MCG tablet Take 1 tablet by mouth daily. 84 tablet 3   No current facility-administered medications on file prior to visit.    No Known Allergies  Social History   Socioeconomic History   Marital status: Single    Spouse name: Not on file   Number of children: Not on file   Years of education: Not on file   Highest education level: Not on file  Occupational History   Not on file  Tobacco Use   Smoking status: Former    Current packs/day: 0.25    Types: Cigarettes    Passive exposure: Current   Smokeless tobacco: Never  Vaping Use   Vaping status: Never Used  Substance and Sexual Activity   Alcohol use: Yes    Comment: rarely   Drug use: Not Currently    Types: Marijuana    Comment: last use 10/19/2019   Sexual activity: Not Currently    Partners: Male    Birth control/protection: Abstinence    Comment: menarche 33yo, sexual debut 33yo  Other Topics Concern    Not on file  Social History Narrative   Not on file   Social Drivers of Health   Financial Resource Strain: Low Risk  (03/14/2024)   Overall Financial Resource Strain (CARDIA)    Difficulty of Paying Living Expenses: Not very hard  Food Insecurity: No Food Insecurity (03/14/2024)   Hunger Vital Sign    Worried About Running Out of Food in the Last Year: Never true    Ran Out of Food in the Last Year: Never true  Transportation Needs: Not on file  Physical Activity: Inactive (03/14/2024)   Exercise Vital Sign    Days of Exercise per Week: 0 days    Minutes of Exercise per Session: 0 min  Stress: No Stress Concern Present (03/14/2024)   [employer] of Occupational Health - Occupational Stress Questionnaire    Feeling of Stress: Not at all  Social Connections: Unknown (03/14/2024)   Social Connection and Isolation Panel    Frequency of Communication with Friends and Family: Twice a week    Frequency of Social Gatherings with Friends and Family: Not on file    Attends Religious Services: More than 4 times per year    Active Member of [employer] or Organizations: No    Attends Banker Meetings: Never    Marital Status: Never married  Intimate Partner Violence: Not At Risk (03/14/2024)   Humiliation, Afraid, Rape, and Kick questionnaire    Fear of Current or Ex-Partner: No    Emotionally Abused: No    Physically Abused: No    Sexually Abused: No    Family History  Problem Relation Age of Onset   Healthy Mother    Hypertension Father    Thyroid disease Father    Other Paternal Grandmother        prediabetes    Past Surgical History:  Procedure Laterality Date   INDUCED ABORTION     08/2022    ROS: Review of Systems Negative except as stated above  PHYSICAL EXAM: BP 137/87   Pulse 71   Temp 98.3 F (36.8 C) (Oral)   Ht 5' 2 (1.575 m)   Wt 187 lb (84.8 kg)   LMP 02/12/2024 (Exact Date)   SpO2 98%   BMI 34.20 kg/m   Physical Exam  General:  well-appearing, no acute distress Skin: no jaundice, rashes, or lesions Head: normocephalic, no lesions, no abnormal hair distribution or loss Eyes: anicteric sclera, pupils equally round and reactive to light and accommodation, extraocular movements intact, appropriate visual acuity Ears: no external lesions, tympanic membrane translucent  Nose: no septal deviation, turbinates clear Throat: trachea midline, no thyromegaly, moist mucus membranes Cardiovascular: regular heart rate and rhythm, normal S1/S2, no murmurs, gallops, or rubs, peripheral pulses 2+ bilaterally Chest: no skeletal deformity, lungs clear to auscultation bilaterally, equal breath sounds bilaterally Abdomen: soft, non-distended, non-tender to palpation, no hepatomegaly, no splenomegaly, normoactive bowel sounds GU: deferred Musculoskeletal: strength of upper and lower extremities equal bilaterally, 5/5, normal gait Extremities: no peripheral edema, nails intact Neurologic: cranial nerves II-XII intact    ASSESSMENT AND PLAN:  1. Encounter to establish care with new provider (Primary)  2. Encounter for annual wellness visit - Complete physical exam performed today, no abnormal findings. - Routine labs to screen for heme, cardiovascular and metabolic disorders. - Discuss plan to increase physical activity to 150 min of moderate intensity exercise per week.  - Congratulated on smoking cessation  3. Encounter for screening for HIV - HIV antibody (with reflex)  4. Diabetes mellitus screening - POCT glycosylated hemoglobin (Hb A1C)  5. Screening for metabolic disorder - Comprehensive metabolic panel with GFR  6. Screening for blood disease - CBC  7. Encounter for lipid screening for cardiovascular disease - Lipid panel  8. Encounter for hepatitis C screening test for low risk patient - Hepatitis C antibody    Patient was given the opportunity to ask questions.  Patient verbalized understanding of the plan  and was able to repeat key elements of the plan.   Orders Placed This Encounter  Procedures   CBC   Comprehensive metabolic panel with GFR   Lipid panel   TSH Rfx on Abnormal to Free T4   POCT glycosylated hemoglobin (Hb A1C)      Return in about 3 months (around 06/14/2024) for follow-up.  Sula Leavy Rode, PA-C

## 2024-03-15 LAB — LIPID PANEL
Chol/HDL Ratio: 3.2 ratio (ref 0.0–4.4)
Cholesterol, Total: 193 mg/dL (ref 100–199)
HDL: 60 mg/dL (ref >39)
LDL Chol Calc (NIH): 115 mg/dL — ABNORMAL HIGH (ref 0–99)
Triglycerides: 99 mg/dL (ref 0–149)
VLDL Cholesterol Cal: 18 mg/dL (ref 5–40)

## 2024-03-15 LAB — COMPREHENSIVE METABOLIC PANEL WITH GFR
ALT: 14 IU/L (ref 0–32)
AST: 24 IU/L (ref 0–40)
Albumin: 4.5 g/dL (ref 3.9–4.9)
Alkaline Phosphatase: 56 IU/L (ref 44–121)
BUN/Creatinine Ratio: 10 (ref 9–23)
BUN: 9 mg/dL (ref 6–20)
Bilirubin Total: 0.3 mg/dL (ref 0.0–1.2)
CO2: 20 mmol/L (ref 20–29)
Calcium: 9.9 mg/dL (ref 8.7–10.2)
Chloride: 102 mmol/L (ref 96–106)
Creatinine, Ser: 0.94 mg/dL (ref 0.57–1.00)
Globulin, Total: 3.4 g/dL (ref 1.5–4.5)
Glucose: 82 mg/dL (ref 70–99)
Potassium: 4 mmol/L (ref 3.5–5.2)
Sodium: 139 mmol/L (ref 134–144)
Total Protein: 7.9 g/dL (ref 6.0–8.5)
eGFR: 82 mL/min/1.73 (ref >59)

## 2024-03-15 LAB — CBC
Hematocrit: 38.1 % (ref 34.0–46.6)
Hemoglobin: 12.1 g/dL (ref 11.1–15.9)
MCH: 26.4 pg — ABNORMAL LOW (ref 26.6–33.0)
MCHC: 31.8 g/dL (ref 31.5–35.7)
MCV: 83 fL (ref 79–97)
Platelets: 346 x10E3/uL (ref 150–450)
RBC: 4.59 x10E6/uL (ref 3.77–5.28)
RDW: 13 % (ref 11.7–15.4)
WBC: 7.9 x10E3/uL (ref 3.4–10.8)

## 2024-03-15 LAB — HEPATITIS C ANTIBODY: Hep C Virus Ab: NONREACTIVE

## 2024-03-15 LAB — HIV ANTIBODY (ROUTINE TESTING W REFLEX): HIV Screen 4th Generation wRfx: NONREACTIVE

## 2024-03-15 NOTE — Addendum Note (Signed)
 Addended by: TAPIA Elnore Cosens on: 03/15/2024 11:52 AM   Modules accepted: Orders

## 2024-03-17 ENCOUNTER — Ambulatory Visit: Payer: Self-pay

## 2024-03-17 NOTE — Progress Notes (Signed)
 I wanted to go over your test results: Hepatitis C and HIV screening tests were negative. You do NOT have either of the diseases.  TSH which looks at your thyroid levels is within Normal range.  Comprehensive metabolic panel which looks at your electrolytes, kidney and liver health shows all values within Normal ranges. CBC which looks at your blood count shows all values within Normal ranges. Lipid panel which looks at your cholesterol shows LDL bad cholesterol is slightly elevated. Not a concern at this time, we can recheck this at a later time. In the mean time, try to decrease intake of greasy and fried foods.  Hb A1c, which is a 3 month average of your glucose levels, is 6.5 which is borderline in the diabetes range but we can still manage your glucose at this time with lifestyle changes. We can recheck this at the next visit. In the meantime, try to increase your physical activity to at least 150 min of moderate intensity exercise per week. Avoid foods high in sugar such as sodas, sweetened drinks like juice, candy and other processed foods.

## 2024-03-21 ENCOUNTER — Other Ambulatory Visit

## 2024-04-19 ENCOUNTER — Ambulatory Visit (INDEPENDENT_AMBULATORY_CARE_PROVIDER_SITE_OTHER)

## 2024-04-19 DIAGNOSIS — Z23 Encounter for immunization: Secondary | ICD-10-CM | POA: Diagnosis not present

## 2024-04-19 NOTE — Progress Notes (Signed)
 04/19/24- 2nd HPV given in the RD, pt tolerated the injection well.    She has scheduled a nurse visit for her 3rd injection

## 2024-06-14 ENCOUNTER — Ambulatory Visit (INDEPENDENT_AMBULATORY_CARE_PROVIDER_SITE_OTHER)

## 2024-06-14 VITALS — BP 119/81 | HR 68 | Temp 99.8°F | Resp 16 | Ht 62.0 in | Wt 191.8 lb

## 2024-06-14 DIAGNOSIS — E119 Type 2 diabetes mellitus without complications: Secondary | ICD-10-CM

## 2024-06-14 DIAGNOSIS — Z131 Encounter for screening for diabetes mellitus: Secondary | ICD-10-CM

## 2024-06-14 LAB — POCT GLYCOSYLATED HEMOGLOBIN (HGB A1C): Hemoglobin A1C: 6.6 % — AB (ref 4.0–5.6)

## 2024-06-14 MED ORDER — METFORMIN HCL ER 500 MG PO TB24: 500.0000 mg | ORAL_TABLET | Freq: Every day | ORAL | 2 refills | Status: AC

## 2024-06-14 NOTE — Progress Notes (Signed)
     Patient ID: Kristie Mays, female    DOB: [birth year]  MRN: [id number]  CC: Follow-up (Weight loss assistance)   Subjective: Kristie Mays is a 33 y.o. female with past medical history of prediabetes who presents to clinic for follow up and to discuss weight loss. No acute concerns at this time. Denies increased thirst, increase urinary frequency or changes in eating habits. Regarding weight loss, pt reports that she has not done well making healthier food choices. Experiences food cravings often and does not exercise.    No Known Allergies  ROS: Review of Systems Negative except as stated above  PHYSICAL EXAM: BP 119/81 (BP Location: Left Arm)   Pulse 68   Temp 99.8 F (37.7 C)   Resp 16   Ht 5' 2 (1.575 m)   Wt 191 lb 12.8 oz (87 kg)   LMP 05/24/2024 (Approximate)   SpO2 96%   BMI 35.08 kg/m   Physical Exam  General: well-appearing, no acute distress Skin: no jaundice, rashes, or lesions Cardiovascular: regular heart rate and rhythm, normal S1/S2, no murmurs, gallops, or rubs, peripheral pulses 2+ bilaterally Chest: no skeletal deformity, lungs clear to auscultation bilaterally, equal breath sounds bilaterally Musculoskeletal: normal gait Extremities: no peripheral edema  ASSESSMENT AND PLAN:  1. Type 2 diabetes mellitus without complication, without long-term current use of insulin (HCC) (Primary) - Education provided on diabetes pathophysiology.  - Encouraged to increase physical activity to include 150 minutes of moderate intensity exercise per week.  - Start metFORMIN  (GLUCOPHAGE -XR) 500 MG 24 hr tablet; Take 1 tablet (500 mg total) by mouth daily with breakfast.  Dispense: 90 tablet; Refill: 2 - TSH Rfx on Abnormal to Free T4  2. Diabetes mellitus screening - POCT glycosylated hemoglobin (Hb A1C) is 6.6 which meets diagnositc criteria for Type 2 diabetes mellitus.    Patient was given the opportunity to ask questions.  Patient verbalized  understanding of the plan and was able to repeat key elements of the plan.    Orders Placed This Encounter  Procedures   TSH Rfx on Abnormal to Free T4   POCT glycosylated hemoglobin (Hb A1C)     Requested Prescriptions   Signed Prescriptions Disp Refills   metFORMIN  (GLUCOPHAGE -XR) 500 MG 24 hr tablet 90 tablet 2    Sig: Take 1 tablet (500 mg total) by mouth daily with breakfast.    Return in about 3 months (around 09/14/2024) for follow-up diabetes, a1c.  Sula Leavy Rode, PA-C

## 2024-06-26 ENCOUNTER — Other Ambulatory Visit: Payer: Self-pay

## 2024-06-26 DIAGNOSIS — E119 Type 2 diabetes mellitus without complications: Secondary | ICD-10-CM

## 2024-06-26 MED ORDER — SEMAGLUTIDE(0.25 OR 0.5MG/DOS) 2 MG/1.5ML ~~LOC~~ SOPN: PEN_INJECTOR | SUBCUTANEOUS | 0 refills | Status: AC

## 2024-07-24 NOTE — Telephone Encounter (Signed)
 Pt did not get the gel so she is going to contact the pharmacy and see if they can assist her and will call back if needed.

## 2024-08-21 ENCOUNTER — Ambulatory Visit

## 2024-08-28 ENCOUNTER — Ambulatory Visit

## 2024-09-18 ENCOUNTER — Ambulatory Visit

## 2024-11-24 ENCOUNTER — Ambulatory Visit: Admitting: Radiology
